# Patient Record
Sex: Female | Born: 1940 | Race: Black or African American | Hispanic: No | Marital: Married | Smoking: Never smoker
Health system: Southern US, Community
[De-identification: ages and names within clinical notes are randomized; demographics above are authoritative.]

## PROBLEM LIST (undated history)

## (undated) DIAGNOSIS — E119 Type 2 diabetes mellitus without complications: Secondary | ICD-10-CM

## (undated) DIAGNOSIS — I10 Essential (primary) hypertension: Secondary | ICD-10-CM

## (undated) DIAGNOSIS — I4892 Unspecified atrial flutter: Secondary | ICD-10-CM

## (undated) HISTORY — PX: CHOLECYSTECTOMY: SHX55

---

## 2003-12-01 ENCOUNTER — Ambulatory Visit (HOSPITAL_COMMUNITY): Admission: RE | Admit: 2003-12-01 | Discharge: 2003-12-01 | Payer: Self-pay | Admitting: Ophthalmology

## 2017-05-14 ENCOUNTER — Emergency Department (HOSPITAL_BASED_OUTPATIENT_CLINIC_OR_DEPARTMENT_OTHER): Payer: Medicare HMO

## 2017-05-14 ENCOUNTER — Emergency Department (HOSPITAL_BASED_OUTPATIENT_CLINIC_OR_DEPARTMENT_OTHER)
Admission: EM | Admit: 2017-05-14 | Discharge: 2017-05-14 | Disposition: A | Discharge status: Home / Self Care | Payer: Medicare HMO | Attending: Emergency Medicine | Admitting: Emergency Medicine

## 2017-05-14 ENCOUNTER — Other Ambulatory Visit: Payer: Self-pay

## 2017-05-14 ENCOUNTER — Encounter (HOSPITAL_BASED_OUTPATIENT_CLINIC_OR_DEPARTMENT_OTHER): Payer: Self-pay | Admitting: Emergency Medicine

## 2017-05-14 DIAGNOSIS — Z9049 Acquired absence of other specified parts of digestive tract: Secondary | ICD-10-CM | POA: Insufficient documentation

## 2017-05-14 DIAGNOSIS — R000 Tachycardia, unspecified: Secondary | ICD-10-CM | POA: Insufficient documentation

## 2017-05-14 DIAGNOSIS — I10 Essential (primary) hypertension: Secondary | ICD-10-CM | POA: Insufficient documentation

## 2017-05-14 DIAGNOSIS — N3000 Acute cystitis without hematuria: Secondary | ICD-10-CM | POA: Insufficient documentation

## 2017-05-14 DIAGNOSIS — E119 Type 2 diabetes mellitus without complications: Secondary | ICD-10-CM | POA: Insufficient documentation

## 2017-05-14 DIAGNOSIS — R1031 Right lower quadrant pain: Secondary | ICD-10-CM | POA: Diagnosis present

## 2017-05-14 HISTORY — DX: Type 2 diabetes mellitus without complications: E11.9

## 2017-05-14 HISTORY — DX: Essential (primary) hypertension: I10

## 2017-05-14 LAB — COMPREHENSIVE METABOLIC PANEL
ALK PHOS: 88 U/L (ref 38–126)
ALT: 13 U/L — ABNORMAL LOW (ref 14–54)
ANION GAP: 12 (ref 5–15)
AST: 22 U/L (ref 15–41)
Albumin: 3.2 g/dL — ABNORMAL LOW (ref 3.5–5.0)
BILIRUBIN TOTAL: 0.3 mg/dL (ref 0.3–1.2)
BUN: 11 mg/dL (ref 6–20)
CALCIUM: 9.2 mg/dL (ref 8.9–10.3)
CO2: 25 mmol/L (ref 22–32)
Chloride: 98 mmol/L — ABNORMAL LOW (ref 101–111)
Creatinine, Ser: 1.02 mg/dL — ABNORMAL HIGH (ref 0.44–1.00)
GFR calc Af Amer: >60 mL/min (ref >60)
GFR calc non Af Amer: 52 mL/min — ABNORMAL LOW (ref >60)
GLUCOSE: 313 mg/dL — AB (ref 65–99)
Potassium: 3.9 mmol/L (ref 3.5–5.1)
Sodium: 135 mmol/L (ref 135–145)
TOTAL PROTEIN: 8.4 g/dL — AB (ref 6.5–8.1)

## 2017-05-14 LAB — URINALYSIS, ROUTINE W REFLEX MICROSCOPIC
BILIRUBIN URINE: NEGATIVE
GLUCOSE, UA: 250 mg/dL — AB
KETONES UR: NEGATIVE mg/dL
Nitrite: NEGATIVE
PROTEIN: 30 mg/dL — AB
Specific Gravity, Urine: <1.005 — ABNORMAL LOW (ref 1.005–1.030)
pH: 7 (ref 5.0–8.0)

## 2017-05-14 LAB — CBC WITH DIFFERENTIAL/PLATELET
Basophils Absolute: 0 10*3/uL (ref 0.0–0.1)
Basophils Relative: 0 %
Eosinophils Absolute: 0.1 10*3/uL (ref 0.0–0.7)
Eosinophils Relative: 1 %
HEMATOCRIT: 32.4 % — AB (ref 36.0–46.0)
Hemoglobin: 10.4 g/dL — ABNORMAL LOW (ref 12.0–15.0)
LYMPHS PCT: 45 %
Lymphs Abs: 3.6 10*3/uL (ref 0.7–4.0)
MCH: 27 pg (ref 26.0–34.0)
MCHC: 32.1 g/dL (ref 30.0–36.0)
MCV: 84.2 fL (ref 78.0–100.0)
MONO ABS: 0.9 10*3/uL (ref 0.1–1.0)
Monocytes Relative: 11 %
NEUTROS ABS: 3.4 10*3/uL (ref 1.7–7.7)
Neutrophils Relative %: 43 %
Platelets: 361 10*3/uL (ref 150–400)
RBC: 3.85 MIL/uL — ABNORMAL LOW (ref 3.87–5.11)
RDW: 15.2 % (ref 11.5–15.5)
WBC: 8 10*3/uL (ref 4.0–10.5)

## 2017-05-14 LAB — URINALYSIS, MICROSCOPIC (REFLEX)

## 2017-05-14 LAB — CBG MONITORING, ED: Glucose-Capillary: 298 mg/dL — ABNORMAL HIGH (ref 65–99)

## 2017-05-14 LAB — TROPONIN I: Troponin I: <0.03 ng/mL (ref <0.03)

## 2017-05-14 MED ORDER — SODIUM CHLORIDE 0.9 % IV BOLUS (SEPSIS)
1000.0000 mL | Freq: Once | INTRAVENOUS | Status: AC
  Administered 2017-05-14: 1000 mL via INTRAVENOUS

## 2017-05-14 MED ORDER — METOPROLOL TARTRATE 50 MG PO TABS
ORAL_TABLET | ORAL | Status: AC
  Filled 2017-05-14: qty 1

## 2017-05-14 MED ORDER — CEPHALEXIN 500 MG PO CAPS: 500.0000 mg | ORAL_CAPSULE | Freq: Four times a day (QID) | ORAL | 0 refills | Status: DC

## 2017-05-14 MED ORDER — METOPROLOL TARTRATE 5 MG/5ML IV SOLN
5.0000 mg | Freq: Once | INTRAVENOUS | Status: AC
  Administered 2017-05-14: 5 mg via INTRAVENOUS
  Filled 2017-05-14: qty 5

## 2017-05-14 MED ORDER — METOPROLOL TARTRATE 25 MG PO TABS: 12.5000 mg | ORAL_TABLET | Freq: Two times a day (BID) | ORAL | 0 refills | Status: DC

## 2017-05-14 MED ORDER — IOPAMIDOL (ISOVUE-300) INJECTION 61%
100.0000 mL | Freq: Once | INTRAVENOUS | Status: AC | PRN
  Administered 2017-05-14: 100 mL via INTRAVENOUS

## 2017-05-14 MED ORDER — DEXTROSE 5 % IV SOLN
1.0000 g | Freq: Once | INTRAVENOUS | Status: AC
  Administered 2017-05-14: 1 g via INTRAVENOUS
  Filled 2017-05-14: qty 10

## 2017-05-14 MED ORDER — SODIUM CHLORIDE 0.9 % IV SOLN: INTRAVENOUS | Status: DC

## 2017-05-14 MED ORDER — METOPROLOL TARTRATE 12.5 MG HALF TABLET
12.5000 mg | ORAL_TABLET | Freq: Once | ORAL | Status: AC
  Administered 2017-05-14: 12.5 mg via ORAL
  Filled 2017-05-14: qty 1

## 2017-05-14 NOTE — ED Notes (Addendum)
Pt reports periods of palpitations and near syncope that started on Thursday. Pt reports that she has frequent periods of near syncope. Pt reports associated ShOB and nausea during those near syncopal episodes.

## 2017-05-14 NOTE — ED Notes (Signed)
Pt on cardiac monitor and auto VS 

## 2017-05-14 NOTE — ED Notes (Signed)
Pt continuing to have brief periods of lightheadedness and near syncope while lying in bed. Orthostatic V/S deferred and EDP notified.

## 2017-05-14 NOTE — ED Notes (Signed)
EDP at bedside  

## 2017-05-14 NOTE — ED Notes (Signed)
Pt given d/c instructions as per chart. Rx x 2. Verbalizes understanding. No questions. 

## 2017-05-14 NOTE — ED Notes (Signed)
Frequency of PSVT on the monitor has significantly reduced. Pt only had one noted episode over past hour with max rate of 145.

## 2017-05-14 NOTE — ED Triage Notes (Signed)
Patient states that she has epigastric pain that radiates up through her chest and up into her head intermittently since Thursday. When the pain radated up into her head she starts to feel dizzy and startd to feel like she is going to pass out

## 2017-05-14 NOTE — ED Provider Notes (Signed)
[facility] EMERGENCY DEPARTMENT Provider Note   CSN: [id number] Arrival date & time: 05/14/17  1418     History   Chief Complaint Chief Complaint  Patient presents with  . Near Syncope  . Abdominal Pain    HPI Erika Newton is a 77 y.o. female.  Pt presents to the ED today with abdominal pain and dizziness.  Pt said sx started on Jan 31.  She said she has RLQ abd pain which radiates up to her chest.  She is tachycardic on exam, but does not feel it.  She feels dizzy when HR goes up on monitor.      Past Medical History:  Diagnosis Date  . Diabetes mellitus without complication (HCC)   . Hypertension     There are no active problems to display for this patient.   Past Surgical History:  Procedure Laterality Date  . CHOLECYSTECTOMY      OB History    No data available       Home Medications    Prior to Admission medications   Medication Sig Start Date End Date Taking? Authorizing Provider  cephALEXin (KEFLEX) 500 MG capsule Take 1 capsule (500 mg total) by mouth 4 (four) times daily. 05/14/17   Jacalyn Lefevre, MD  metoprolol tartrate (LOPRESSOR) 25 MG tablet Take 0.5 tablets (12.5 mg total) by mouth 2 (two) times daily. 05/14/17   Jacalyn Lefevre, MD    Family History History reviewed. No pertinent family history.  Social History Social History   Tobacco Use  . Smoking status: Never Smoker  . Smokeless tobacco: Never Used  Substance Use Topics  . Alcohol use: No    Frequency: Never  . Drug use: No     Allergies   Patient has no known allergies.   Review of Systems Review of Systems  Gastrointestinal: Positive for abdominal pain.  Neurological: Positive for dizziness.  All other systems reviewed and are negative.    Physical Exam Updated Vital Signs BP (!) 186/84   Pulse 97   Temp 99 F (37.2 C) (Oral)   Resp 12   Ht 5\' 5"  (1.651 m)   Wt 77.1 kg (170 lb)   SpO2 95%   BMI 28.29 kg/m   Physical Exam  Constitutional:  She is oriented to person, place, and time. She appears well-developed and well-nourished.  HENT:  Head: Normocephalic and atraumatic.  Mouth/Throat: Oropharynx is clear and moist.  Eyes: EOM are normal. Pupils are equal, round, and reactive to light.  Cardiovascular: Normal rate, regular rhythm, normal heart sounds and intact distal pulses.  Pulmonary/Chest: Effort normal.  Abdominal: Soft. Normal appearance and bowel sounds are normal. There is tenderness in the right lower quadrant.  Neurological: She is alert and oriented to person, place, and time.  Skin: Skin is warm. Capillary refill takes less than 2 seconds.  Psychiatric: She has a normal mood and affect. Her behavior is normal.  Nursing note and vitals reviewed.    ED Treatments / Results  Labs (all labs ordered are listed, but only abnormal results are displayed) Labs Reviewed  CBC WITH DIFFERENTIAL/PLATELET - Abnormal; Notable for the following components:      Result Value   RBC 3.85 (*)    Hemoglobin 10.4 (*)    HCT 32.4 (*)    All other components within normal limits  COMPREHENSIVE METABOLIC PANEL - Abnormal; Notable for the following components:   Chloride 98 (*)    Glucose, Bld 313 (*)  Creatinine, Ser 1.02 (*)    Total Protein 8.4 (*)    Albumin 3.2 (*)    ALT 13 (*)    GFR calc non Af Amer 52 (*)    All other components within normal limits  URINALYSIS, ROUTINE W REFLEX MICROSCOPIC - Abnormal; Notable for the following components:   APPearance CLOUDY (*)    Specific Gravity, Urine <1.005 (*)    Glucose, UA 250 (*)    Hgb urine dipstick LARGE (*)    Protein, ur 30 (*)    Leukocytes, UA LARGE (*)    All other components within normal limits  URINALYSIS, MICROSCOPIC (REFLEX) - Abnormal; Notable for the following components:   Bacteria, UA MANY (*)    Squamous Epithelial / LPF 0-5 (*)    All other components within normal limits  CBG MONITORING, ED - Abnormal; Notable for the following components:    Glucose-Capillary 298 (*)    All other components within normal limits  TROPONIN I  TSH    EKG  EKG Interpretation  Date/Time:  Sunday May 14 2017 15:02:26 EST Ventricular Rate:  164 PR Interval:    QRS Duration: 83 QT Interval:  304 QTC Calculation: 503 R Axis:   32 Text Interpretation:  Age not entered, assumed to be  77 years old for purpose of ECG interpretation Sinus tachycardia Anterior infarct, old Nonspecific T abnormalities, lateral leads Prolonged QT interval Confirmed by Jacalyn LefevreHaviland, Joncarlo Friberg [phone]([id number]) on 05/14/2017 4:26:03 PM       Radiology Dg Chest 2 View  Result Date: 05/14/2017 CLINICAL DATA:  Epigastric pain radiating to the chest 4 days ago with near syncopal episodes and hyperglycemia. EXAM: CHEST  2 VIEW COMPARISON:  12/01/2003 FINDINGS: The heart size and mediastinal contours are within normal limits. Aortic atherosclerosis along the transverse portion. Chronic elevation of the right hemidiaphragm. No pneumonic consolidation or CHF. No effusion or pneumothorax. 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 IMPRESSION: No active cardiopulmonary disease. Aortic atherosclerosis. Electronically Signed   By: Tollie Ethavid  Kwon M.D.   On: 05/14/2017 17:08   Ct Abdomen Pelvis W Contrast  Result Date: 05/14/2017 CLINICAL DATA:  Epigastric pain since Thursday. EXAM: CT ABDOMEN AND PELVIS WITH CONTRAST TECHNIQUE: Multidetector CT imaging of the abdomen and pelvis was performed using the standard protocol following bolus administration of intravenous contrast. CONTRAST:  100mL ISOVUE-300 IOPAMIDOL (ISOVUE-300) INJECTION 61% COMPARISON:  None. FINDINGS: Lower chest: No acute abnormality. Thoracic aortic atherosclerosis. Left main and three-vessel coronary arteriosclerosis. Hepatobiliary: No focal liver abnormality is seen. Status post cholecystectomy. No biliary dilatation. Pancreas: Unremarkable. No pancreatic ductal dilatation or surrounding inflammatory changes. Spleen:  Normal in size without focal abnormality. Adrenals/Urinary Tract: Normal bilateral adrenal glands. Bilateral staghorn calculi with mild cortical scarring and thinning involving the upper pole and interpolar right kidney. Scattered hypodensities within both kidneys may reflect mild caliectasis and/or small parapelvic and cortical renal cysts. No hydroureteronephrosis. There is mild mucosal thickening of the proximal ureters bilaterally which may reflect inflammatory change. Urinary bladder is without focal mass or calculus. Stomach/Bowel: Stomach is within normal limits. Appendix appears normal. No evidence of bowel wall thickening, distention, or inflammatory changes. Vascular/Lymphatic: Mild aortoiliac and branch vessel atherosclerosis. No adenopathy. Reproductive: Uterus and bilateral adnexa are unremarkable. Other: Small fat containing periumbilical hernia. No free air nor free fluid. Musculoskeletal: No acute or significant osseous findings. IMPRESSION: 1. 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 2. Left main and three-vessel coronary arteriosclerosis with thoracic  aortic atherosclerosis. 3. No acute bowel obstruction or inflammatory change. Electronically Signed   By: Tollie Eth M.D.   On: 05/14/2017 17:02    Procedures Procedures (including critical care time)  Medications Ordered in ED Medications  sodium chloride 0.9 % bolus 1,000 mL (0 mLs Intravenous Stopped 05/14/17 1735)    And  0.9 %  sodium chloride infusion (not administered)  metoprolol tartrate (LOPRESSOR) 50 MG tablet (not administered)  sodium chloride 0.9 % bolus 1,000 mL (1,000 mLs Intravenous New Bag/Given 05/14/17 1527)  metoprolol tartrate (LOPRESSOR) injection 5 mg (5 mg Intravenous Given 05/14/17 1531)  iopamidol (ISOVUE-300) 61 % injection 100 mL (100 mLs Intravenous Contrast Given 05/14/17 1623)  cefTRIAXone (ROCEPHIN) 1 g in dextrose 5 % 50 mL IVPB (1 g  Intravenous New Bag/Given 05/14/17 1736)  metoprolol tartrate (LOPRESSOR) tablet 12.5 mg (12.5 mg Oral Given 05/14/17 1751)     Initial Impression / Assessment and Plan / ED Course  I have reviewed the triage vital signs and the nursing notes.  Pertinent labs & imaging results that were available during my care of the patient were reviewed by me and considered in my medical decision making (see chart for details).    Lopressor helped with HR.  Pt given oral lopressor and bp is tolerating it.  Pt does not have a pcp since her doctor retired at [facility].  She is to call [facility] tomorrow and try to establish with a different doctor at that practice as she likes that practice.  She is to f/u with cardiology regarding the tachycardia.  Return if worse.  Final Clinical Impressions(s) / ED Diagnoses   Final diagnoses:  Acute cystitis without hematuria  Sinus tachycardia    ED Discharge Orders        Ordered    metoprolol tartrate (LOPRESSOR) 25 MG tablet  2 times daily     05/14/17 1842    cephALEXin (KEFLEX) 500 MG capsule  4 times daily     05/14/17 1842       Jacalyn Lefevre, MD 05/14/17 1844

## 2017-05-15 LAB — TSH: TSH: 1.741 u[IU]/mL (ref 0.350–4.500)

## 2017-10-25 ENCOUNTER — Emergency Department (HOSPITAL_BASED_OUTPATIENT_CLINIC_OR_DEPARTMENT_OTHER)
Admission: EM | Admit: 2017-10-25 | Discharge: 2017-10-25 | Disposition: A | Discharge status: Home / Self Care | Payer: Medicare HMO | Attending: Emergency Medicine | Admitting: Emergency Medicine

## 2017-10-25 ENCOUNTER — Encounter (HOSPITAL_BASED_OUTPATIENT_CLINIC_OR_DEPARTMENT_OTHER): Payer: Self-pay

## 2017-10-25 ENCOUNTER — Emergency Department (HOSPITAL_BASED_OUTPATIENT_CLINIC_OR_DEPARTMENT_OTHER): Payer: Medicare HMO

## 2017-10-25 ENCOUNTER — Other Ambulatory Visit: Payer: Self-pay

## 2017-10-25 DIAGNOSIS — E119 Type 2 diabetes mellitus without complications: Secondary | ICD-10-CM | POA: Insufficient documentation

## 2017-10-25 DIAGNOSIS — I10 Essential (primary) hypertension: Secondary | ICD-10-CM | POA: Diagnosis not present

## 2017-10-25 DIAGNOSIS — R55 Syncope and collapse: Secondary | ICD-10-CM | POA: Insufficient documentation

## 2017-10-25 HISTORY — DX: Unspecified atrial flutter: I48.92

## 2017-10-25 LAB — CBC
HCT: 31.6 % — ABNORMAL LOW (ref 36.0–46.0)
HEMOGLOBIN: 10.2 g/dL — AB (ref 12.0–15.0)
MCH: 27.6 pg (ref 26.0–34.0)
MCHC: 32.3 g/dL (ref 30.0–36.0)
MCV: 85.6 fL (ref 78.0–100.0)
Platelets: 333 10*3/uL (ref 150–400)
RBC: 3.69 MIL/uL — ABNORMAL LOW (ref 3.87–5.11)
RDW: 16.8 % — ABNORMAL HIGH (ref 11.5–15.5)
WBC: 7.9 10*3/uL (ref 4.0–10.5)

## 2017-10-25 LAB — URINALYSIS, ROUTINE W REFLEX MICROSCOPIC

## 2017-10-25 LAB — COMPREHENSIVE METABOLIC PANEL
ALK PHOS: 90 U/L (ref 38–126)
ALT: 17 U/L (ref 0–44)
ANION GAP: 9 (ref 5–15)
AST: 22 U/L (ref 15–41)
Albumin: 3.4 g/dL — ABNORMAL LOW (ref 3.5–5.0)
BUN: 21 mg/dL (ref 8–23)
CALCIUM: 8.8 mg/dL — AB (ref 8.9–10.3)
CO2: 28 mmol/L (ref 22–32)
Chloride: 98 mmol/L (ref 98–111)
Creatinine, Ser: 1.27 mg/dL — ABNORMAL HIGH (ref 0.44–1.00)
GFR calc non Af Amer: 40 mL/min — ABNORMAL LOW (ref >60)
GFR, EST AFRICAN AMERICAN: 47 mL/min — AB (ref >60)
GLUCOSE: 289 mg/dL — AB (ref 70–99)
Potassium: 4.2 mmol/L (ref 3.5–5.1)
Sodium: 135 mmol/L (ref 135–145)
Total Bilirubin: 0.2 mg/dL — ABNORMAL LOW (ref 0.3–1.2)
Total Protein: 8.3 g/dL — ABNORMAL HIGH (ref 6.5–8.1)

## 2017-10-25 LAB — LIPASE, BLOOD: Lipase: 49 U/L (ref 11–51)

## 2017-10-25 LAB — URINALYSIS, MICROSCOPIC (REFLEX): WBC, UA: >50 WBC/hpf (ref 0–5)

## 2017-10-25 LAB — CBG MONITORING, ED: Glucose-Capillary: 284 mg/dL — ABNORMAL HIGH (ref 70–99)

## 2017-10-25 LAB — TROPONIN I: Troponin I: <0.03 ng/mL (ref <0.03)

## 2017-10-25 NOTE — ED Triage Notes (Signed)
Pt entered triage via w/c-NAD-alert-sister states per pt's husband that pt passed out this am-stated he was calling her name and she would not respond-approx 11am-pt states her last recall prior to event was she felt SOB-having CP and sat down in a chair-pt points to mid chest and epigastric for pain site

## 2017-10-25 NOTE — ED Provider Notes (Signed)
[facility] EMERGENCY DEPARTMENT Provider Note   CSN: [id number] Arrival date & time: 10/25/17  1221     History   Chief Complaint Chief Complaint  Patient presents with  . Loss of Consciousness    HPI Alfredo BattyLizzie Belkin is a 77 y.o. female.  HPI Patient is a 77 year old female who presents to the emergency department after feeling lightheaded and dizzy after unloading groceries today.  She felt normal this morning when awakening.  She felt normal at the grocery store.  She came home from the grocery store and placed the groceries away and then sat down and became lightheaded.  No preceding chest pain or palpitations.  Is reported by nursing staff that she passed out and would not respond to her name but this is not the history that I get from the patient's husband who was with the patient at the time.  She reports some epigastric and right upper quadrant discomfort at this time.  She states it feels like a grabbing pain.  She denies anterior chest pain.  No shortness of breath at this time.  No fevers or chills.  No productive cough.  No recent illness.  Denies new medications.  Rather asymptomatic at rest at this time.   Past Medical History:  Diagnosis Date  . Diabetes mellitus without complication (HCC)   . Hypertension     There are no active problems to display for this patient.   Past Surgical History:  Procedure Laterality Date  . CHOLECYSTECTOMY       OB History   None      Home Medications    Prior to Admission medications   Medication Sig Start Date End Date Taking? Authorizing Provider  metoprolol tartrate (LOPRESSOR) 25 MG tablet Take 0.5 tablets (12.5 mg total) by mouth 2 (two) times daily. 05/14/17   Jacalyn LefevreHaviland, Julie, MD    Family History No family history on file.  Social History Social History   Tobacco Use  . Smoking status: Never Smoker  . Smokeless tobacco: Never Used  Substance Use Topics  . Alcohol use: No    Frequency: Never  .  Drug use: No     Allergies   Patient has no known allergies.   Review of Systems Review of Systems  All other systems reviewed and are negative.    Physical Exam Updated Vital Signs BP (!) 142/65   Pulse 75   Temp 97.9 F (36.6 C) (Oral)   Resp 16   Ht 5\' 5"  (1.651 m)   Wt 78 kg (172 lb)   SpO2 100%   BMI 28.62 kg/m   Physical Exam  Constitutional: She is oriented to person, place, and time. She appears well-developed and well-nourished. No distress.  HENT:  Head: Normocephalic and atraumatic.  Eyes: EOM are normal.  Neck: Normal range of motion.  Cardiovascular: Normal rate, regular rhythm and normal heart sounds.  Pulmonary/Chest: Effort normal and breath sounds normal.  Abdominal: Soft. She exhibits no distension. There is no tenderness.  Musculoskeletal: Normal range of motion.  Neurological: She is alert and oriented to person, place, and time.  Skin: Skin is warm and dry.  Psychiatric: She has a normal mood and affect. Judgment normal.  Nursing note and vitals reviewed.    ED Treatments / Results  Labs (all labs ordered are listed, but only abnormal results are displayed) Labs Reviewed  CBC - Abnormal; Notable for the following components:      Result Value   RBC 3.69 (*)  Hemoglobin 10.2 (*)    HCT 31.6 (*)    RDW 16.8 (*)    All other components within normal limits  COMPREHENSIVE METABOLIC PANEL - Abnormal; Notable for the following components:   Glucose, Bld 289 (*)    Creatinine, Ser 1.27 (*)    Calcium 8.8 (*)    Total Protein 8.3 (*)    Albumin 3.4 (*)    Total Bilirubin 0.2 (*)    GFR calc non Af Amer 40 (*)    GFR calc Af Amer 47 (*)    All other components within normal limits  CBG MONITORING, ED - Abnormal; Notable for the following components:   Glucose-Capillary 284 (*)    All other components within normal limits  TROPONIN I  LIPASE, BLOOD    EKG EKG Interpretation  Date/Time:  Wednesday October 25 2017 12:38:53  EDT Ventricular Rate:  79 PR Interval:    QRS Duration: 90 QT Interval:  379 QTC Calculation: 435 R Axis:   5 Text Interpretation:  Sinus rhythm Inferior infarct, old Anterior infarct, old No significant change was found expect for slower rate than Feb 2019 Confirmed by Azalia Bilis ([id number]) on 10/25/2017 1:08:02 PM   Radiology Dg Chest 2 View  Result Date: 10/25/2017 CLINICAL DATA:  Chest pain and shortness of breath EXAM: CHEST - 2 VIEW COMPARISON:  05/14/2017 FINDINGS: The heart size and mediastinal contours are within normal limits. Both lungs are clear. The visualized skeletal structures are unremarkable. IMPRESSION: No active cardiopulmonary disease. Electronically Signed   By: Deatra Robinson M.D.   On: 10/25/2017 13:39    Procedures Procedures (including critical care time)  Medications Ordered in ED Medications - No data to display   Initial Impression / Assessment and Plan / ED Course  I have reviewed the triage vital signs and the nursing notes.  Pertinent labs & imaging results that were available during my care of the patient were reviewed by me and considered in my medical decision making (see chart for details).     Patient is overall well-appearing.  No significant symptoms at this time.  EKG without ischemic changes.  Primary care follow-up.  Ambulatory in the emergency department.  Nonspecific upper abdominal discomfort.  Doubt PE.  Doubt ACS.  Doubt dissection.  No loss consciousness.   Final Clinical Impressions(s) / ED Diagnoses   Final diagnoses:  None    ED Discharge Orders    None       Azalia Bilis, MD 10/25/17 [phone]

## 2017-10-25 NOTE — ED Notes (Signed)
Patient transported to X-ray 

## 2019-01-24 IMAGING — DX DG CHEST 2V
2 series · 2 of 2 positions shown · non-contrast
Comparison: 12/01/2003

CLINICAL DATA: Epigastric pain radiating to the chest 4 days ago
with near syncopal episodes and hyperglycemia.

EXAM:
CHEST  2 VIEW

[chest lat]
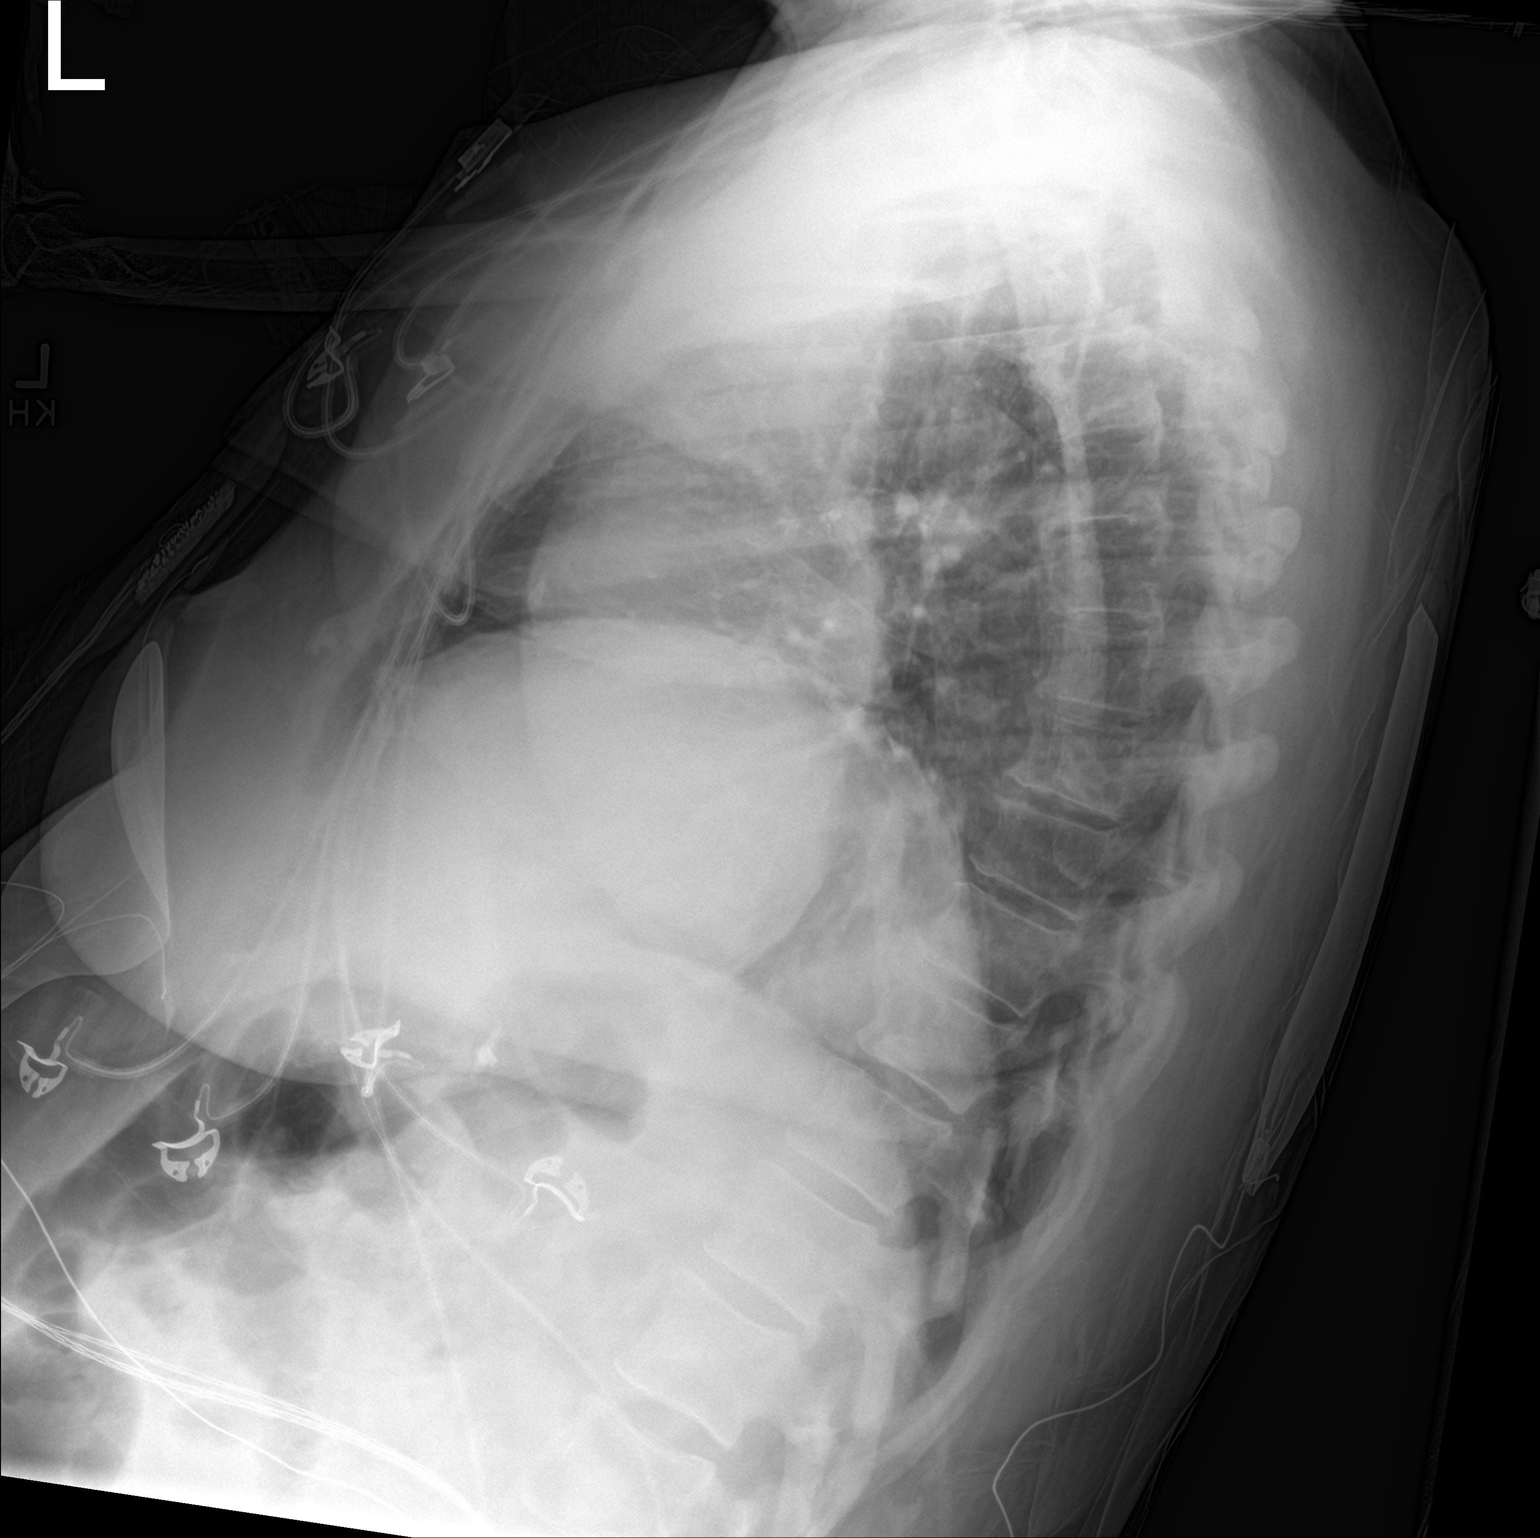

[chest ap strecther]
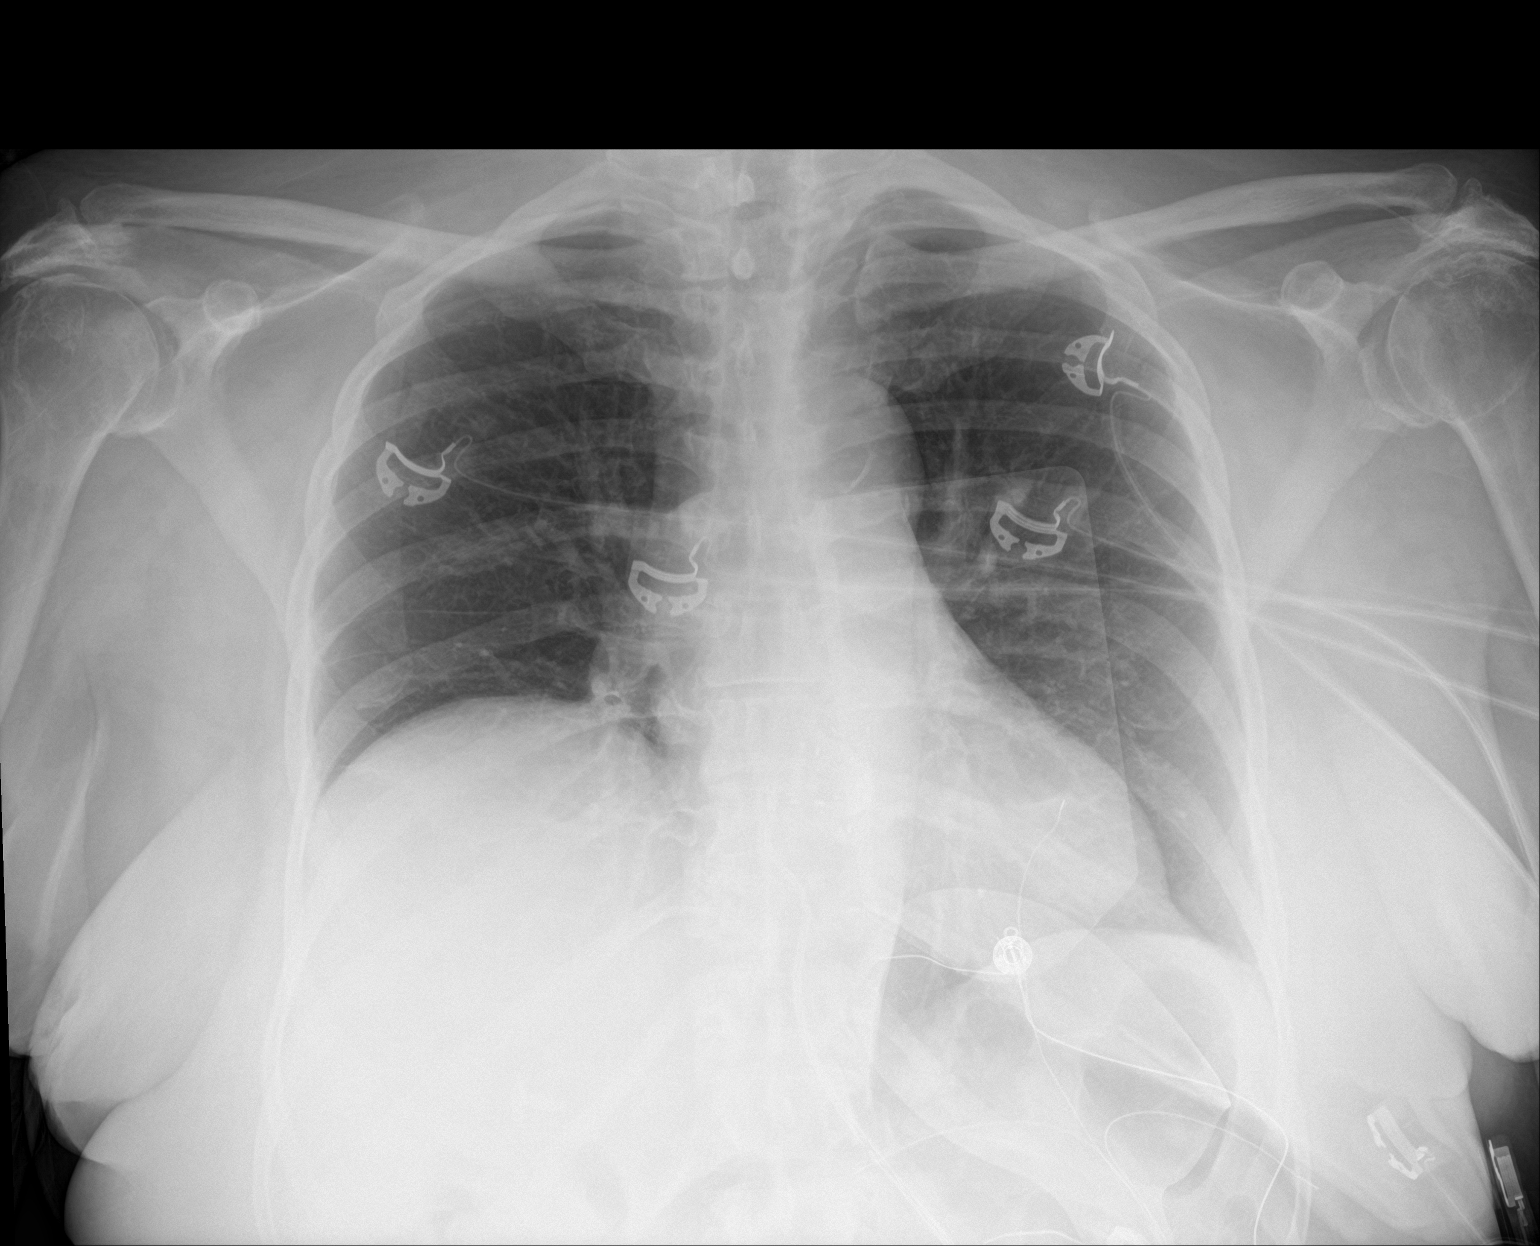

[2 of 2 positions shown; findings below may reference images not displayed]

FINDINGS: The heart size and mediastinal contours are within normal limits.
Aortic atherosclerosis along the transverse portion. Chronic
elevation of the right hemidiaphragm. No pneumonic consolidation or
CHF. No effusion or pneumothorax. Osteoarthritis of the AC and
glenohumeral joints bilaterally.
IMPRESSION: No active cardiopulmonary disease.

Aortic atherosclerosis.

## 2019-01-24 IMAGING — CT CT ABD-PELV W/ CM
2 of 5 series · 16 of 46 positions shown, 18 images · IV contrast (iopamidol)
Comparison: None.

CLINICAL DATA: Epigastric pain since [REDACTED].

EXAM:
CT ABDOMEN AND PELVIS WITH CONTRAST
TECHNIQUE: Multidetector CT imaging of the abdomen and pelvis was performed
using the standard protocol following bolus administration of
intravenous contrast.
CONTRAST:  100mL WSC3CF-888 IOPAMIDOL (WSC3CF-888) INJECTION 61%

[Series 2: axial st · axial · 0.82mm/px · z∈[+418,+898]mm · 13 of 108 slices shown, 15 images]
[im 6/108  soft-tissue]
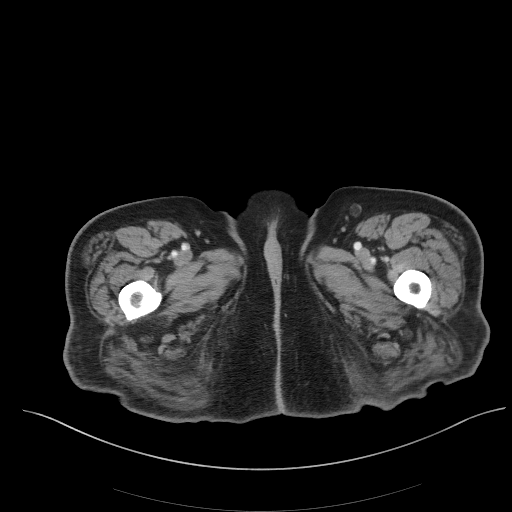
[im 6/108  bone]
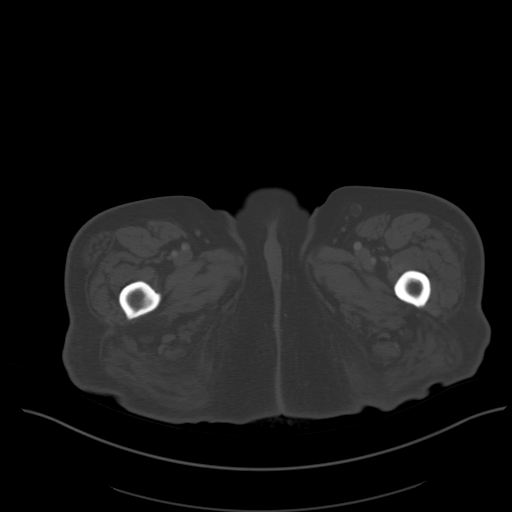
[im 12/108  soft-tissue]
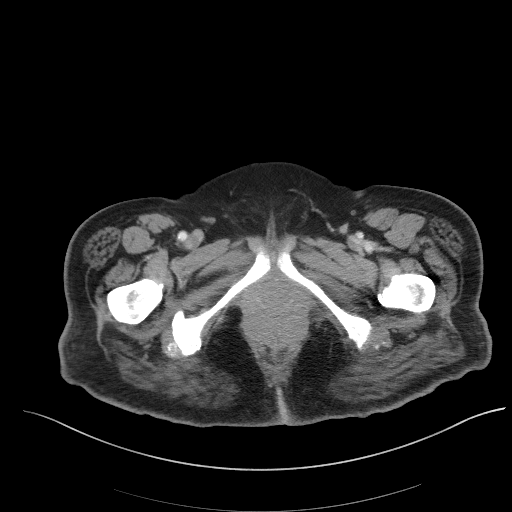
[im 24/108  soft-tissue]
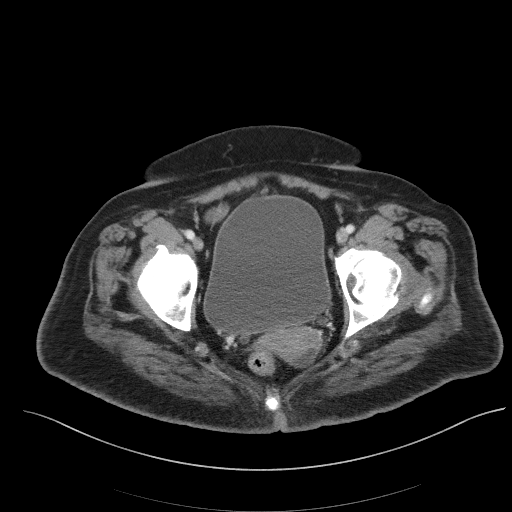
[im 30/108  soft-tissue]
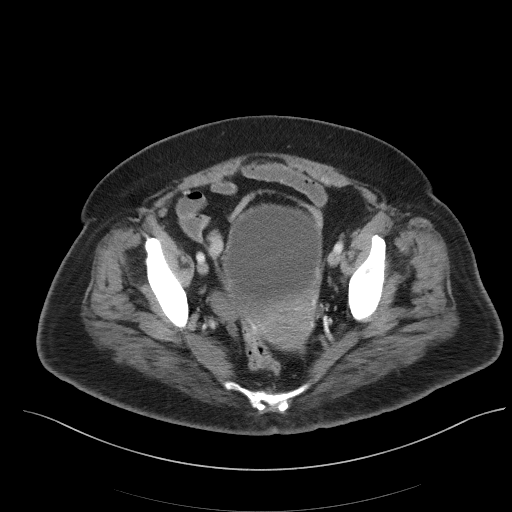
[im 36/108  soft-tissue]
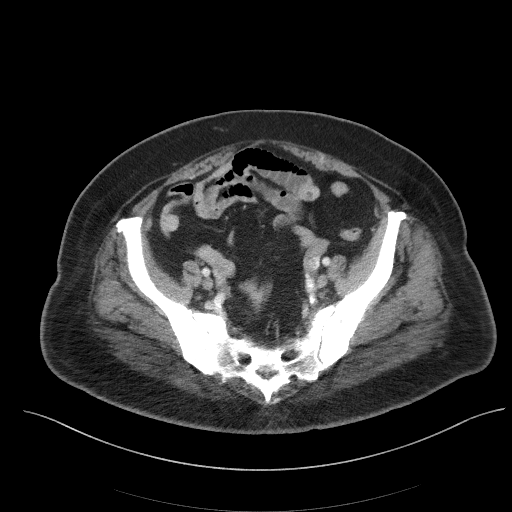
[im 48/108  soft-tissue]
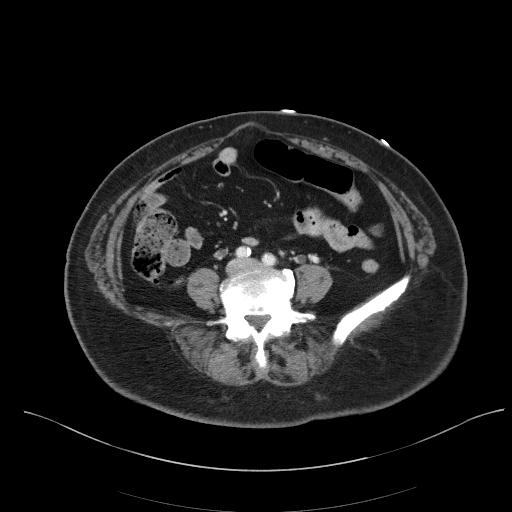
[im 54/108  soft-tissue]
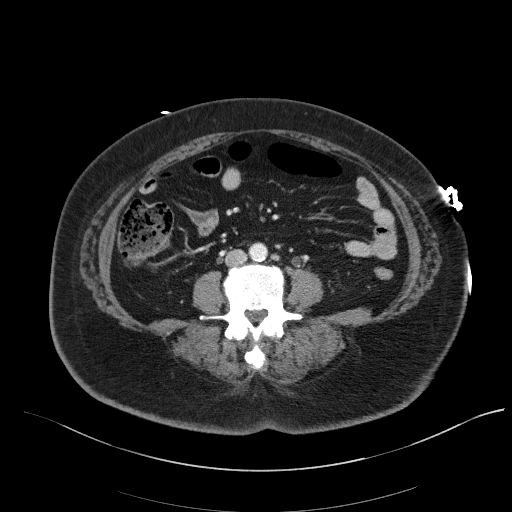
[im 60/108  soft-tissue]
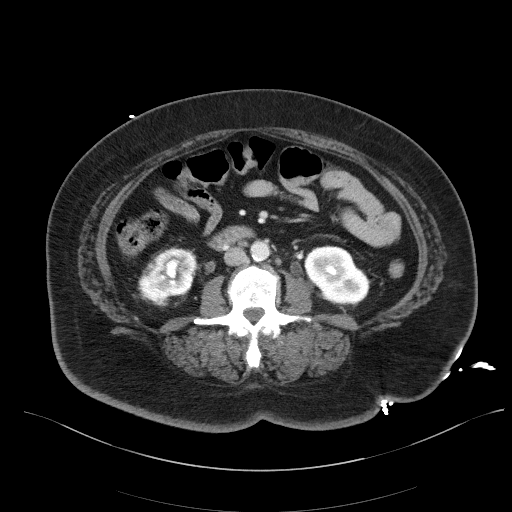
[im 72/108  soft-tissue]
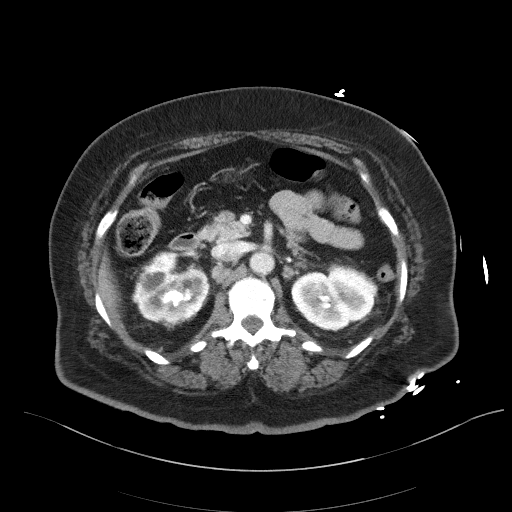
[im 72/108  bone]
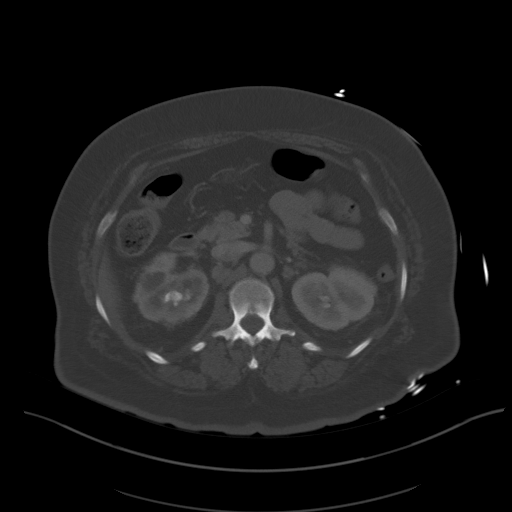
[im 78/108  soft-tissue]
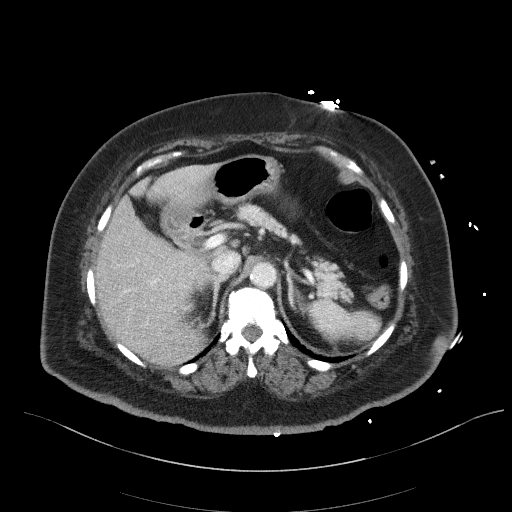
[im 84/108  soft-tissue]
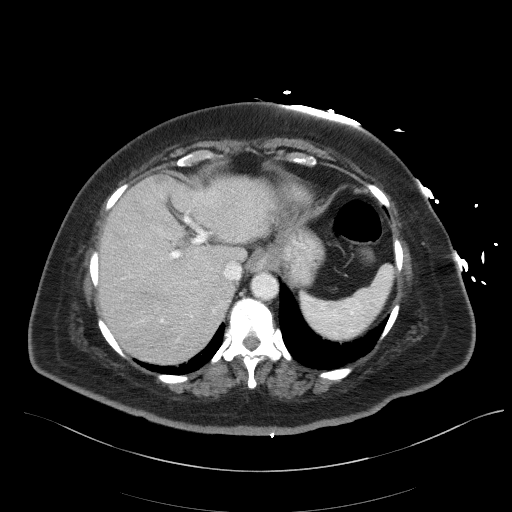
[im 96/108  soft-tissue]
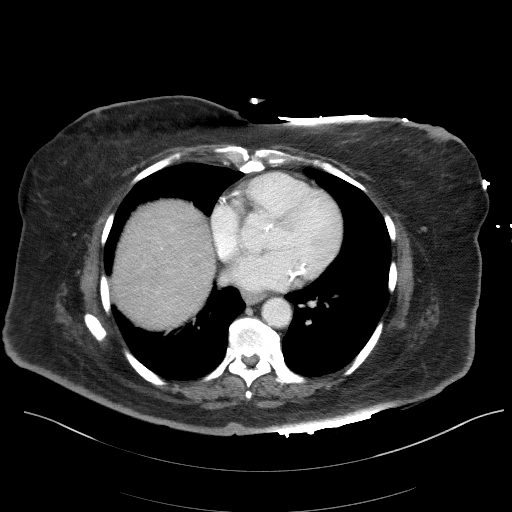
[im 102/108  soft-tissue]
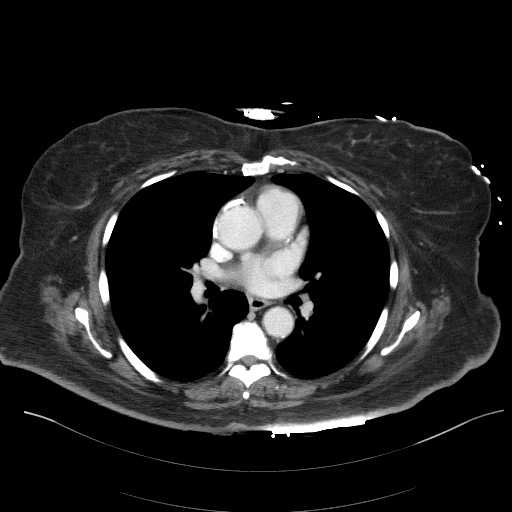

[Series 5: coronal st · coronal · 0.83mm/px · 3 of 89 slices shown]
[im 30/89  soft-tissue]
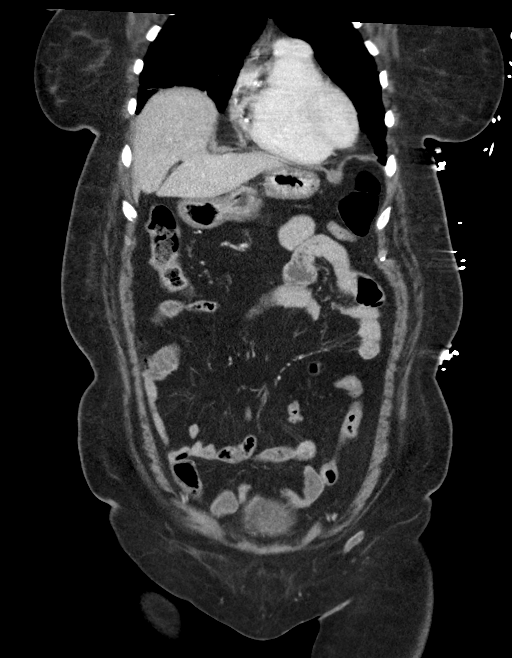
[im 40/89  soft-tissue]
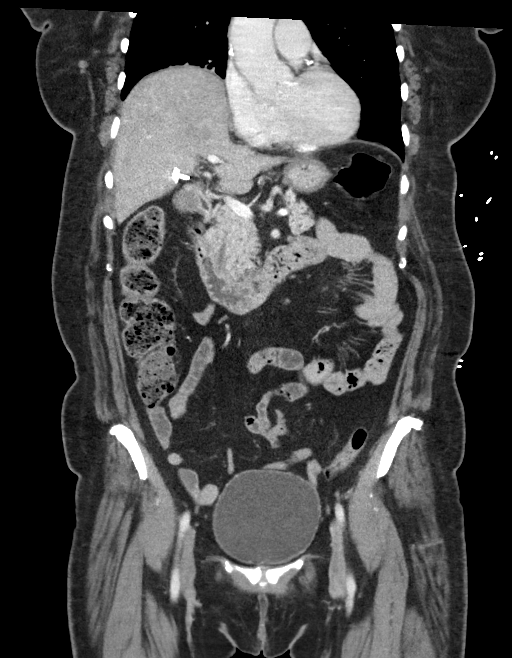
[im 49/89  soft-tissue]
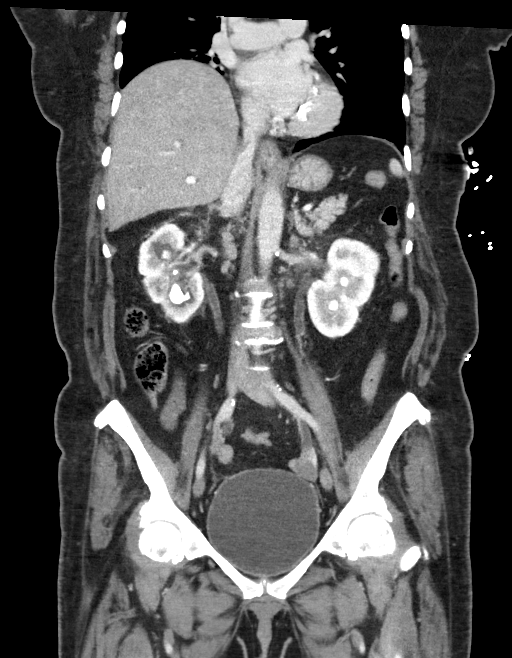

[16 of 46 positions shown; findings below may reference images not displayed]

FINDINGS: Lower chest: No acute abnormality. Thoracic aortic atherosclerosis.
Left main and three-vessel coronary arteriosclerosis.

Hepatobiliary: No focal liver abnormality is seen. Status post
cholecystectomy. No biliary dilatation.

Pancreas: Unremarkable. No pancreatic ductal dilatation or
surrounding inflammatory changes.

Spleen: Normal in size without focal abnormality.

Adrenals/Urinary Tract: Normal bilateral adrenal glands. Bilateral
staghorn calculi with mild cortical scarring and thinning involving
the upper pole and interpolar right kidney. Scattered hypodensities
within both kidneys may reflect mild caliectasis and/or small
parapelvic and cortical renal cysts. No hydroureteronephrosis. There
is mild mucosal thickening of the proximal ureters bilaterally which
may reflect inflammatory change. Urinary bladder is without focal
mass or calculus.

Stomach/Bowel: Stomach is within normal limits. Appendix appears
normal. No evidence of bowel wall thickening, distention, or
inflammatory changes.

Vascular/Lymphatic: Mild aortoiliac and branch vessel
atherosclerosis. No adenopathy.

Reproductive: Uterus and bilateral adnexa are unremarkable.

Other: Small fat containing periumbilical hernia. No free air nor
free fluid.

Musculoskeletal: No acute or significant osseous findings.
IMPRESSION: 1. Bilateral staghorn renal calculi with mild presumed inflammatory
thickening of the proximal ureters bilaterally.
2. Left main and three-vessel coronary arteriosclerosis with
thoracic aortic atherosclerosis.
3. No acute bowel obstruction or inflammatory change.

## 2019-07-07 IMAGING — CR DG CHEST 2V
2 series · 2 of 2 positions shown · non-contrast
Comparison: 05/14/2017

CLINICAL DATA: Chest pain and shortness of breath

EXAM:
CHEST - 2 VIEW

[w chest pa]
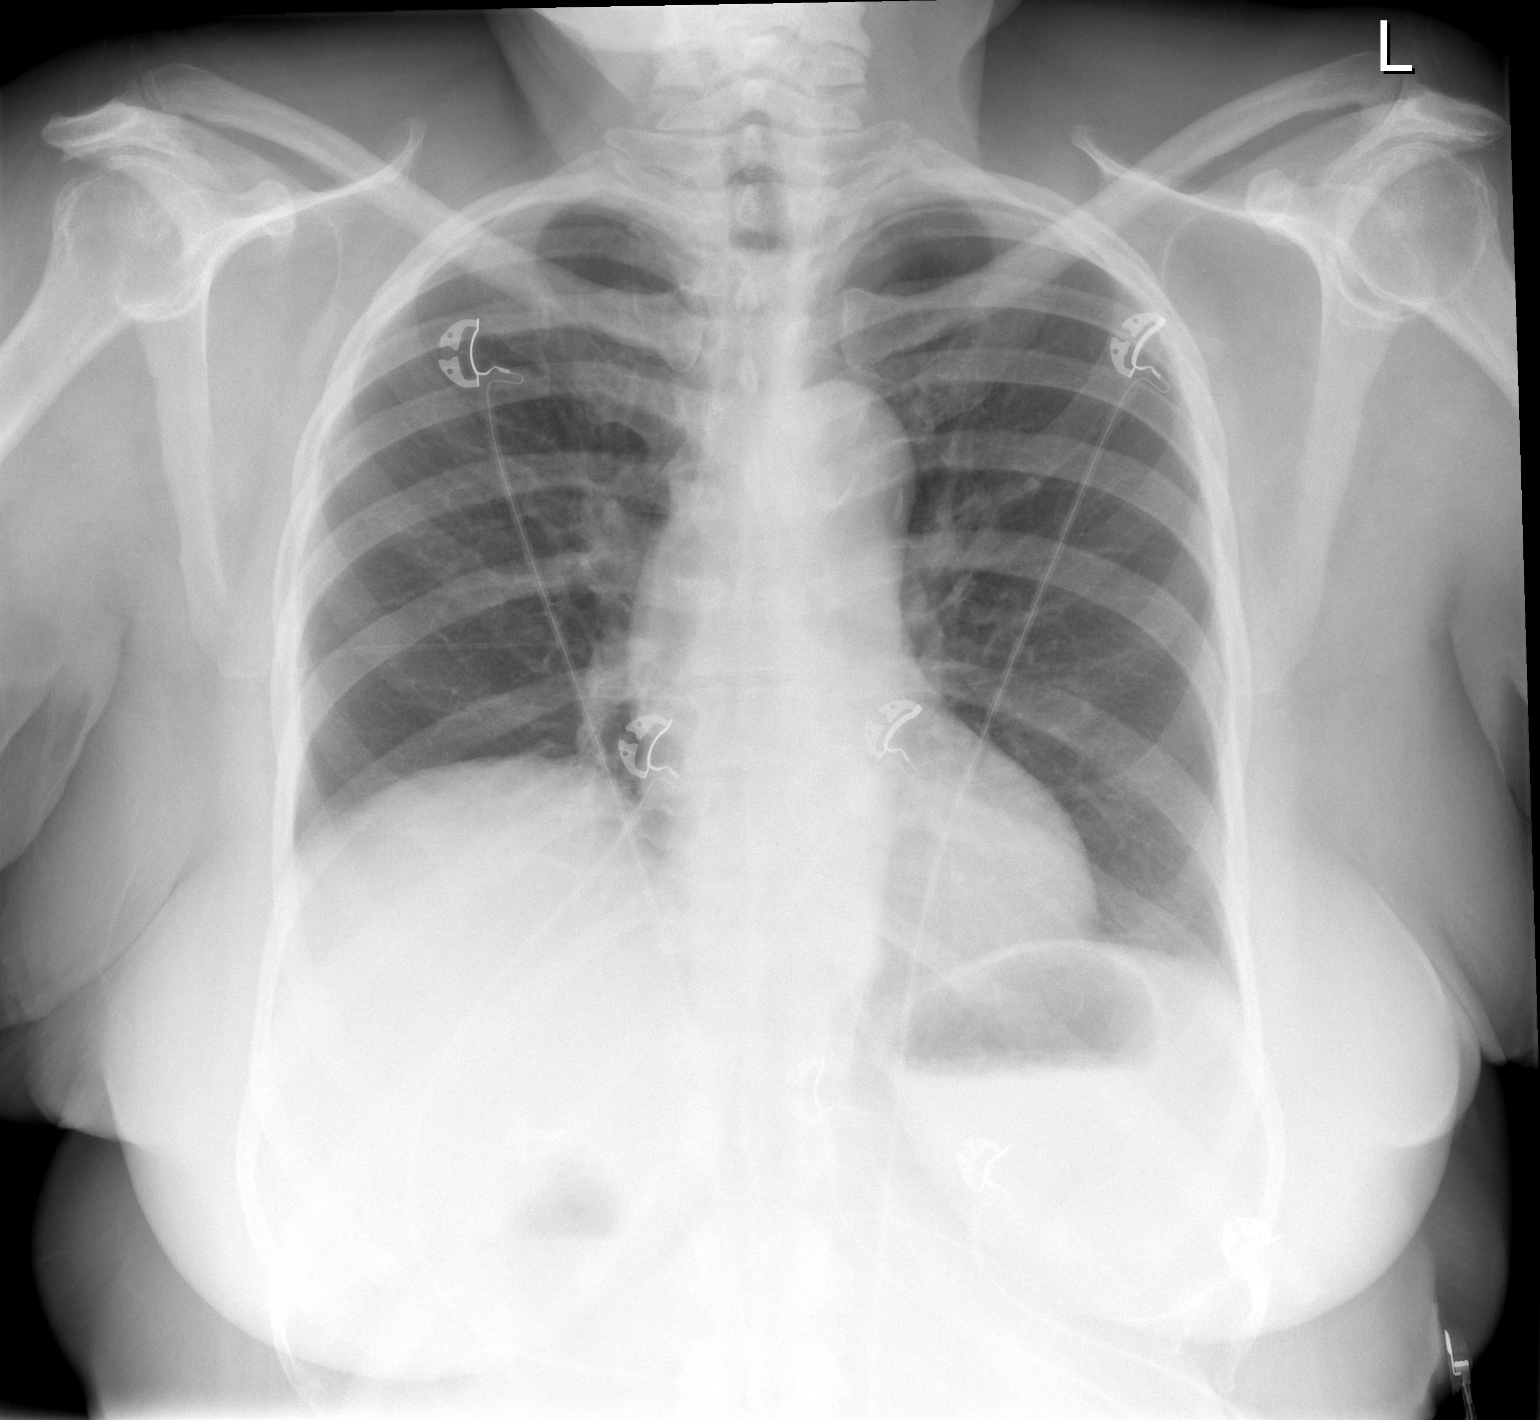

[w chest lat]
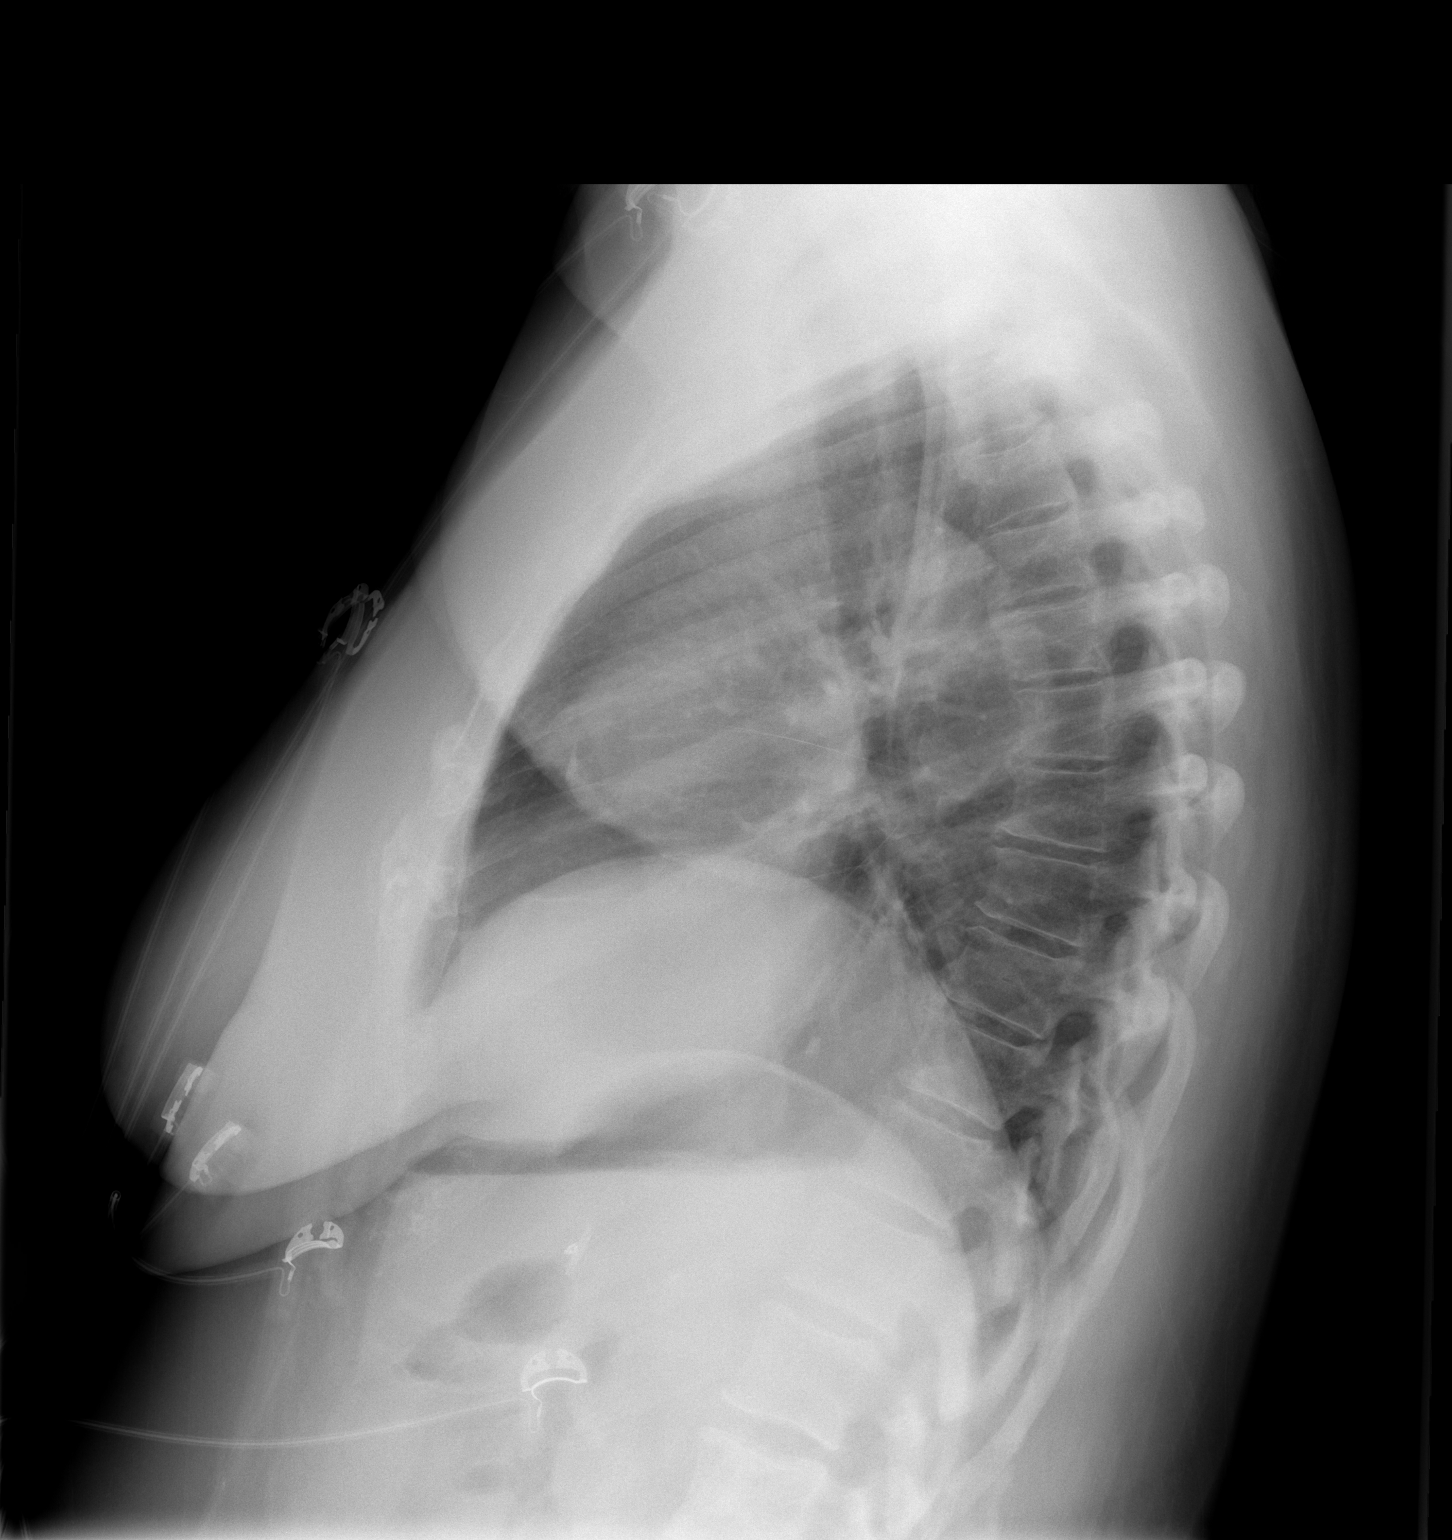

[2 of 2 positions shown; findings below may reference images not displayed]

FINDINGS: The heart size and mediastinal contours are within normal limits.
Both lungs are clear. The visualized skeletal structures are
unremarkable.
IMPRESSION: No active cardiopulmonary disease.
